# Patient Record
Sex: Female | Born: 1945 | Race: White | Hispanic: No | Marital: Single | Smoking: Never smoker
Health system: Southern US, Community
[De-identification: ages and names within clinical notes are randomized; demographics above are authoritative.]

## PROBLEM LIST (undated history)

## (undated) DIAGNOSIS — N1330 Unspecified hydronephrosis: Secondary | ICD-10-CM

## (undated) DIAGNOSIS — Z8542 Personal history of malignant neoplasm of other parts of uterus: Secondary | ICD-10-CM

## (undated) DIAGNOSIS — I10 Essential (primary) hypertension: Secondary | ICD-10-CM

## (undated) DIAGNOSIS — Z86718 Personal history of other venous thrombosis and embolism: Secondary | ICD-10-CM

## (undated) DIAGNOSIS — D4959 Neoplasm of unspecified behavior of other genitourinary organ: Secondary | ICD-10-CM

---

## 1973-07-31 HISTORY — PX: OTHER SURGICAL HISTORY: SHX169

## 1999-10-20 ENCOUNTER — Emergency Department (HOSPITAL_COMMUNITY): Admission: EM | Admit: 1999-10-20 | Discharge: 1999-10-20 | Payer: Self-pay | Admitting: Emergency Medicine

## 2007-05-13 ENCOUNTER — Emergency Department (HOSPITAL_COMMUNITY): Admission: EM | Admit: 2007-05-13 | Discharge: 2007-05-13 | Payer: Self-pay | Admitting: Emergency Medicine
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
## 2009-07-31 HISTORY — PX: ABDOMINAL HYSTERECTOMY: SHX81

## 2010-04-15 ENCOUNTER — Encounter: Admission: RE | Admit: 2010-04-15 | Discharge: 2010-04-15 | Payer: Self-pay | Admitting: Geriatric Medicine

## 2010-05-02 ENCOUNTER — Ambulatory Visit (HOSPITAL_COMMUNITY): Admission: RE | Admit: 2010-05-02 | Discharge: 2010-05-02 | Payer: Self-pay | Admitting: Obstetrics and Gynecology

## 2010-05-02 HISTORY — PX: DILATION AND CURETTAGE OF UTERUS: SHX78

## 2010-10-13 LAB — TYPE AND SCREEN: Antibody Screen: NEGATIVE

## 2010-10-13 LAB — CBC
HCT: 41.9 % (ref 36.0–46.0)
Hemoglobin: 14.1 g/dL (ref 12.0–15.0)
MCHC: 33.6 g/dL (ref 30.0–36.0)
MCV: 92.9 fL (ref 78.0–100.0)
Platelets: 287 10*3/uL (ref 150–400)
RDW: 13.4 % (ref 11.5–15.5)

## 2011-05-11 LAB — URINE MICROSCOPIC-ADD ON

## 2011-05-11 LAB — URINALYSIS, ROUTINE W REFLEX MICROSCOPIC: Glucose, UA: NEGATIVE

## 2012-12-13 ENCOUNTER — Emergency Department (HOSPITAL_COMMUNITY): Payer: No Typology Code available for payment source

## 2012-12-13 ENCOUNTER — Emergency Department (HOSPITAL_COMMUNITY)
Admission: EM | Admit: 2012-12-13 | Discharge: 2012-12-13 | Disposition: A | Discharge status: Home / Self Care | Payer: No Typology Code available for payment source | Attending: Emergency Medicine | Admitting: Emergency Medicine

## 2012-12-13 ENCOUNTER — Encounter (HOSPITAL_COMMUNITY): Payer: Self-pay | Admitting: *Deleted

## 2012-12-13 DIAGNOSIS — S8990XA Unspecified injury of unspecified lower leg, initial encounter: Secondary | ICD-10-CM | POA: Insufficient documentation

## 2012-12-13 DIAGNOSIS — IMO0002 Reserved for concepts with insufficient information to code with codable children: Secondary | ICD-10-CM | POA: Insufficient documentation

## 2012-12-13 DIAGNOSIS — Y92410 Unspecified street and highway as the place of occurrence of the external cause: Secondary | ICD-10-CM | POA: Insufficient documentation

## 2012-12-13 DIAGNOSIS — Y9389 Activity, other specified: Secondary | ICD-10-CM | POA: Insufficient documentation

## 2012-12-13 DIAGNOSIS — Z23 Encounter for immunization: Secondary | ICD-10-CM | POA: Insufficient documentation

## 2012-12-13 DIAGNOSIS — M25562 Pain in left knee: Secondary | ICD-10-CM

## 2012-12-13 DIAGNOSIS — I10 Essential (primary) hypertension: Secondary | ICD-10-CM | POA: Insufficient documentation

## 2012-12-13 HISTORY — DX: Essential (primary) hypertension: I10

## 2012-12-13 MED ORDER — BACITRACIN 500 UNIT/GM EX OINT
1.0000 "application " | TOPICAL_OINTMENT | Freq: Two times a day (BID) | CUTANEOUS | Status: DC
  Administered 2012-12-13: 1 via TOPICAL
  Filled 2012-12-13 (×2): qty 0.9

## 2012-12-13 MED ORDER — TETANUS-DIPHTH-ACELL PERTUSSIS 5-2.5-18.5 LF-MCG/0.5 IM SUSP
0.5000 mL | Freq: Once | INTRAMUSCULAR | Status: AC
  Administered 2012-12-13: 0.5 mL via INTRAMUSCULAR
  Filled 2012-12-13: qty 0.5
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
## 2012-12-13 NOTE — ED Notes (Signed)
Patient with request to use the phone. Patient to X Ray.
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
## 2012-12-14 NOTE — ED Provider Notes (Signed)
Medical screening examination/treatment/procedure(s) were performed by non-physician practitioner and as supervising physician I was immediately available for consultation/collaboration.  Geoffery Lyons, MD 12/14/12 2351

## 2012-12-30 ENCOUNTER — Encounter (HOSPITAL_COMMUNITY): Payer: Self-pay | Admitting: Emergency Medicine

## 2012-12-30 ENCOUNTER — Emergency Department (HOSPITAL_COMMUNITY)
Admission: EM | Admit: 2012-12-30 | Discharge: 2012-12-30 | Disposition: A | Discharge status: Home / Self Care | Payer: No Typology Code available for payment source | Attending: Emergency Medicine | Admitting: Emergency Medicine

## 2012-12-30 DIAGNOSIS — Z4802 Encounter for removal of sutures: Secondary | ICD-10-CM

## 2012-12-30 DIAGNOSIS — I10 Essential (primary) hypertension: Secondary | ICD-10-CM | POA: Insufficient documentation

## 2012-12-30 NOTE — ED Notes (Signed)
Pt states she had sutures placed and is here to have removal.
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
## 2012-12-30 NOTE — ED Notes (Signed)
Pt c/o suture removal to l eye brow.

## 2012-12-31 NOTE — ED Provider Notes (Signed)
Medical screening examination/treatment/procedure(s) were performed by non-physician practitioner and as supervising physician I was immediately available for consultation/collaboration.  Laureen Frederic L Donalda Job, MD 12/31/12 1533 

## 2013-05-01 ENCOUNTER — Other Ambulatory Visit: Payer: Self-pay | Admitting: Urology

## 2013-05-01 DIAGNOSIS — R1900 Intra-abdominal and pelvic swelling, mass and lump, unspecified site: Secondary | ICD-10-CM

## 2013-05-01 DIAGNOSIS — J984 Other disorders of lung: Secondary | ICD-10-CM

## 2013-05-09 ENCOUNTER — Other Ambulatory Visit: Payer: Self-pay | Admitting: Urology

## 2013-05-09 ENCOUNTER — Encounter (HOSPITAL_BASED_OUTPATIENT_CLINIC_OR_DEPARTMENT_OTHER): Payer: Self-pay | Admitting: *Deleted
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
## 2013-05-12 ENCOUNTER — Ambulatory Visit (HOSPITAL_COMMUNITY): Payer: Managed Care, Other (non HMO)

## 2013-05-12 ENCOUNTER — Encounter (HOSPITAL_BASED_OUTPATIENT_CLINIC_OR_DEPARTMENT_OTHER): Payer: Managed Care, Other (non HMO) | Admitting: Anesthesiology

## 2013-05-12 ENCOUNTER — Encounter (HOSPITAL_BASED_OUTPATIENT_CLINIC_OR_DEPARTMENT_OTHER): Payer: Self-pay

## 2013-05-12 ENCOUNTER — Ambulatory Visit (HOSPITAL_BASED_OUTPATIENT_CLINIC_OR_DEPARTMENT_OTHER): Payer: Managed Care, Other (non HMO) | Admitting: Anesthesiology

## 2013-05-12 ENCOUNTER — Encounter (HOSPITAL_BASED_OUTPATIENT_CLINIC_OR_DEPARTMENT_OTHER)
Admission: RE | Disposition: A | Discharge status: Home / Self Care | Payer: Self-pay | Source: Ambulatory Visit | Attending: Urology

## 2013-05-12 ENCOUNTER — Ambulatory Visit (HOSPITAL_BASED_OUTPATIENT_CLINIC_OR_DEPARTMENT_OTHER)
Admission: RE | Admit: 2013-05-12 | Discharge: 2013-05-12 | Disposition: A | Discharge status: Home / Self Care | Payer: Managed Care, Other (non HMO) | Source: Ambulatory Visit | Attending: Urology | Admitting: Urology

## 2013-05-12 DIAGNOSIS — I10 Essential (primary) hypertension: Secondary | ICD-10-CM | POA: Insufficient documentation

## 2013-05-12 DIAGNOSIS — Z90710 Acquired absence of both cervix and uterus: Secondary | ICD-10-CM | POA: Insufficient documentation

## 2013-05-12 DIAGNOSIS — Z8542 Personal history of malignant neoplasm of other parts of uterus: Secondary | ICD-10-CM | POA: Insufficient documentation

## 2013-05-12 DIAGNOSIS — N1330 Unspecified hydronephrosis: Secondary | ICD-10-CM | POA: Insufficient documentation

## 2013-05-12 DIAGNOSIS — C669 Malignant neoplasm of unspecified ureter: Secondary | ICD-10-CM | POA: Insufficient documentation

## 2013-05-12 DIAGNOSIS — Z79899 Other long term (current) drug therapy: Secondary | ICD-10-CM | POA: Insufficient documentation

## 2013-05-12 HISTORY — DX: Unspecified hydronephrosis: N13.30

## 2013-05-12 HISTORY — PX: CYSTOSCOPY WITH STENT PLACEMENT: SHX5790

## 2013-05-12 HISTORY — PX: CYSTOSCOPY W/ URETERAL STENT PLACEMENT: SHX1429

## 2013-05-12 LAB — POCT I-STAT 4, (NA,K, GLUC, HGB,HCT)
Hemoglobin: 12.9 g/dL (ref 12.0–15.0)
Potassium: 3.6 mEq/L (ref 3.5–5.1)
Sodium: 138 mEq/L (ref 135–145)

## 2013-05-12 SURGERY — CYSTOSCOPY, WITH RETROGRADE PYELOGRAM AND URETERAL STENT INSERTION
Anesthesia: General | Site: Ureter | Laterality: Right | Wound class: Clean Contaminated

## 2013-05-12 MED ORDER — LACTATED RINGERS IV SOLN
INTRAVENOUS | Status: DC | PRN
  Administered 2013-05-12 (×2): via INTRAVENOUS

## 2013-05-12 MED ORDER — LACTATED RINGERS IV SOLN
INTRAVENOUS | Status: DC
  Filled 2013-05-12: qty 1000

## 2013-05-12 MED ORDER — BELLADONNA ALKALOIDS-OPIUM 16.2-60 MG RE SUPP
RECTAL | Status: DC | PRN
  Administered 2013-05-12: 1 via RECTAL

## 2013-05-12 MED ORDER — SODIUM CHLORIDE 0.9 % IJ SOLN
3.0000 mL | Freq: Two times a day (BID) | INTRAMUSCULAR | Status: DC
  Filled 2013-05-12: qty 3

## 2013-05-12 MED ORDER — ONDANSETRON HCL 4 MG/2ML IJ SOLN
4.0000 mg | Freq: Four times a day (QID) | INTRAMUSCULAR | Status: DC | PRN
  Filled 2013-05-12: qty 2

## 2013-05-12 MED ORDER — ONDANSETRON HCL 4 MG/2ML IJ SOLN
INTRAMUSCULAR | Status: DC | PRN
  Administered 2013-05-12: 4 mg via INTRAMUSCULAR

## 2013-05-12 MED ORDER — OXYBUTYNIN CHLORIDE 5 MG PO TABS: 5.0000 mg | ORAL_TABLET | Freq: Three times a day (TID) | ORAL | Status: AC

## 2013-05-12 MED ORDER — DEXAMETHASONE SODIUM PHOSPHATE 4 MG/ML IJ SOLN
INTRAMUSCULAR | Status: DC | PRN
  Administered 2013-05-12: 8 mg via INTRAVENOUS

## 2013-05-12 MED ORDER — LACTATED RINGERS IV SOLN
INTRAVENOUS | Status: DC
  Administered 2013-05-12: 08:00:00 via INTRAVENOUS
  Filled 2013-05-12: qty 1000

## 2013-05-12 MED ORDER — CEFAZOLIN SODIUM-DEXTROSE 2-3 GM-% IV SOLR
INTRAVENOUS | Status: DC | PRN
  Administered 2013-05-12: 2 g via INTRAVENOUS

## 2013-05-12 MED ORDER — PROPOFOL 10 MG/ML IV BOLUS
INTRAVENOUS | Status: DC | PRN
  Administered 2013-05-12: 120 mg via INTRAVENOUS

## 2013-05-12 MED ORDER — HYDROCODONE-ACETAMINOPHEN 5-325 MG PO TABS: 1.0000 | ORAL_TABLET | ORAL | Status: AC | PRN

## 2013-05-12 MED ORDER — IOHEXOL 350 MG/ML SOLN
INTRAVENOUS | Status: DC | PRN
  Administered 2013-05-12: 10 mL

## 2013-05-12 MED ORDER — MIDAZOLAM HCL 5 MG/5ML IJ SOLN
INTRAMUSCULAR | Status: DC | PRN
  Administered 2013-05-12: 1 mg via INTRAVENOUS
  Administered 2013-05-12 (×2): 0.5 mg via INTRAVENOUS

## 2013-05-12 MED ORDER — MORPHINE SULFATE 2 MG/ML IJ SOLN
1.0000 mg | INTRAMUSCULAR | Status: DC | PRN
  Filled 2013-05-12: qty 1

## 2013-05-12 MED ORDER — SODIUM CHLORIDE 0.9 % IR SOLN
Status: DC | PRN
  Administered 2013-05-12: 6000 mL

## 2013-05-12 MED ORDER — SODIUM CHLORIDE 0.9 % IV SOLN
250.0000 mL | INTRAVENOUS | Status: DC | PRN
  Filled 2013-05-12: qty 250

## 2013-05-12 MED ORDER — ACETAMINOPHEN 650 MG RE SUPP
650.0000 mg | RECTAL | Status: DC | PRN
  Filled 2013-05-12: qty 1

## 2013-05-12 MED ORDER — SODIUM CHLORIDE 0.9 % IJ SOLN
3.0000 mL | INTRAMUSCULAR | Status: DC | PRN
  Filled 2013-05-12: qty 3

## 2013-05-12 MED ORDER — FENTANYL CITRATE 0.05 MG/ML IJ SOLN
INTRAMUSCULAR | Status: DC | PRN
  Administered 2013-05-12 (×4): 25 ug via INTRAVENOUS

## 2013-05-12 MED ORDER — KETOROLAC TROMETHAMINE 30 MG/ML IJ SOLN
INTRAMUSCULAR | Status: DC | PRN
  Administered 2013-05-12: 7 mg via INTRAVENOUS
  Administered 2013-05-12: 30 mg via INTRAVENOUS

## 2013-05-12 MED ORDER — FENTANYL CITRATE 0.05 MG/ML IJ SOLN
25.0000 ug | INTRAMUSCULAR | Status: DC | PRN
  Filled 2013-05-12: qty 1

## 2013-05-12 MED ORDER — LIDOCAINE HCL (CARDIAC) 20 MG/ML IV SOLN
INTRAVENOUS | Status: DC | PRN
  Administered 2013-05-12: 60 mg via INTRAVENOUS

## 2013-05-12 MED ORDER — OXYCODONE HCL 5 MG PO TABS
5.0000 mg | ORAL_TABLET | ORAL | Status: DC | PRN
  Filled 2013-05-12: qty 2

## 2013-05-12 MED ORDER — ACETAMINOPHEN 325 MG PO TABS
650.0000 mg | ORAL_TABLET | ORAL | Status: DC | PRN
  Filled 2013-05-12: qty 2

## 2013-05-12 MED ORDER — CIPROFLOXACIN HCL 250 MG PO TABS: 250.0000 mg | ORAL_TABLET | Freq: Two times a day (BID) | ORAL | Status: DC

## 2013-05-12 SURGICAL SUPPLY — 22 items
ADAPTER CATH URET PLST 4-6FR (CATHETERS) IMPLANT
ADPR CATH URET STRL DISP 4-6FR (CATHETERS)
BAG DRAIN URO-CYSTO SKYTR STRL (DRAIN) ×2 IMPLANT
BAG DRN UROCATH (DRAIN) ×1
CANISTER SUCT LVC 12 LTR MEDI- (MISCELLANEOUS) ×1 IMPLANT
CATH INTERMIT  6FR 70CM (CATHETERS) IMPLANT
CLOTH BEACON ORANGE TIMEOUT ST (SAFETY) ×2 IMPLANT
DRAPE CAMERA CLOSED 9X96 (DRAPES) ×2 IMPLANT
DRESSING TELFA 8X3 (GAUZE/BANDAGES/DRESSINGS) ×1 IMPLANT
FORCEPS BIOP 2.4F 115CM BACKLD (INSTRUMENTS) ×1 IMPLANT
GLOVE BIO SURGEON STRL SZ8 (GLOVE) ×2 IMPLANT
GLOVE BIOGEL PI IND STRL 7.5 (GLOVE) IMPLANT
GLOVE BIOGEL PI INDICATOR 7.5 (GLOVE) ×3
GLOVE ECLIPSE 7.0 STRL STRAW (GLOVE) ×1 IMPLANT
GOWN STRL REIN XL XLG (GOWN DISPOSABLE) ×3 IMPLANT
GUIDEWIRE 0.038 PTFE COATED (WIRE) IMPLANT
GUIDEWIRE ANG ZIPWIRE 038X150 (WIRE) IMPLANT
GUIDEWIRE STR DUAL SENSOR (WIRE) IMPLANT
IV NS IRRIG 3000ML ARTHROMATIC (IV SOLUTION) ×1 IMPLANT
NS IRRIG 500ML POUR BTL (IV SOLUTION) IMPLANT
PACK CYSTOSCOPY (CUSTOM PROCEDURE TRAY) ×2 IMPLANT
STENT CONTOUR 7FRX24 (STENTS) ×1 IMPLANT

## 2013-05-12 NOTE — Op Note (Signed)
Preoperative diagnosis:Right hydronephrosis  Postoperative diagnosis: Right hydronephrosis with ureteral tumor  Procedure:Cystoscopy, right retrograde ureteropyelogram, right ureteroscopy with biopsy of ureteral tumor, right double J stent placement , interpretive fluoroscopy   Surgeon: Bertram Millard. Darlin Stenseth, M.D.   Anesthesia: Gen.   Complications:None  Drain(s): 7 x 24 cm contour stent without string  Indications: 67 year old Latino female, recently presenting with a pelvic mass on the right and a history of gynecologic cancer. CT scan performed for abdominal pain and hematuria revealed obstruction of the right kidney with hydronephrosis. She presents at this time for cystoscopy, right retrograde pyelogram possible stent placement.    Technique and findings: The patient was properly identified in the holding area. She received preoperative IV antibiotics. The surgical site was marked. She was taken to the operating room where general anesthetic was administered with the LMA. She was placed in the dorsolithotomy position. Genitalia and perineum were prepped and draped. Proper timeout was performed.  I passed a 22 French panendoscope in her bladder. Inspection of the bladder revealed normal findings. The right ureteral orifice was cannulated with the open-ended catheter retrograde was performed. This revealed the last 3-4 cm of the ureter to be normal. However, there was a significant filling defect with a "goblet sign" proximal to this. There was no significant contrast that got by this. I then passed a sensor-tip guidewire fluoroscopically up in the right renal pelvis were curl was seen. I then removed the cystoscope. A 6 French rigid ureteroscope was then passed through the urethra, the bladder and up to this abnormal area in the right ureter. There was a papillary configuration to this tumor. I was unable to negotiate the scope by the tumor. However, using the Bigopsy biopsy forceps, I grasped  2-3 small fragments of this tumor and sent this as "right ureteral tumor". There was minor amount of bleeding at this point, but I was able to visualize the tumor at the same time. I then removed the ureteroscope. I then passed, using cystoscopic guidance, a 24 cm x 7 French contour stent without string. It was somewhat difficult to negotiate this past the tumor and up in the renal pelvis. However, with diligence this was performed.  Good proximal and distal curls were seen fluoroscopically and cystoscopically following removal of the guidewire. I could see a fair amount of urine coming from the stent at this point him a drain in the right renal unit. The bladder was then drained. The patient was then awakened and taken to the PACU in stable condition. She tolerated the procedure well.

## 2013-05-12 NOTE — Transfer of Care (Signed)
Immediate Anesthesia Transfer of Care Note  Patient: Marissa Conner  Procedure(s) Performed: Procedure(s) (LRB): CYSTOSCOPY WITH RETROGRADE PYELOGRAM/, URETERSCOPY WITH URETERAL BIOPSY (Right) CYSTOSCOPY WITH STENT PLACEMENT (Right)  Patient Location: PACU  Anesthesia Type: General  Level of Consciousness: awake, sedated, patient cooperative and responds to stimulation  Airway & Oxygen Therapy: Patient Spontanous Breathing and Patient connected to face mask oxygen  Post-op Assessment: Report given to PACU RN, Post -op Vital signs reviewed and stable and Patient moving all extremities  Post vital signs: Reviewed and stable  Complications: No apparent anesthesia complications
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
## 2013-05-12 NOTE — Progress Notes (Signed)
Takes blood thinner but not sure of name and brought prescription bottle of Losartan/HCTZ 100/12.5 mg Daily.

## 2013-05-12 NOTE — Anesthesia Postprocedure Evaluation (Signed)
  Anesthesia Post-op Note  Patient: Marissa Conner  Procedure(s) Performed: Procedure(s) (LRB): CYSTOSCOPY WITH RETROGRADE PYELOGRAM/, URETERSCOPY WITH URETERAL BIOPSY (Right) CYSTOSCOPY WITH STENT PLACEMENT (Right)  Patient Location: PACU  Anesthesia Type: General  Level of Consciousness: awake and alert   Airway and Oxygen Therapy: Patient Spontanous Breathing  Post-op Pain: mild  Post-op Assessment: Post-op Vital signs reviewed, Patient's Cardiovascular Status Stable, Respiratory Function Stable, Patent Airway and No signs of Nausea or vomiting  Last Vitals:  Filed Vitals:   05/12/13 1030  BP: 126/75  Pulse: 65  Temp:   Resp: 21    Post-op Vital Signs: stable   Complications: No apparent anesthesia complications

## 2013-05-12 NOTE — Anesthesia Preprocedure Evaluation (Addendum)
Anesthesia Evaluation  Patient identified by MRN, date of birth, ID band Patient awake    Reviewed: Allergy & Precautions, H&P , NPO status , Patient's Chart, lab work & pertinent test results  Airway Mallampati: II TM Distance: >3 FB Neck ROM: full    Dental  (+) Edentulous Upper and Missing Missing left side of lower teeth:   Pulmonary neg pulmonary ROS,  breath sounds clear to auscultation  Pulmonary exam normal       Cardiovascular Exercise Tolerance: Good hypertension, Pt. on medications Rhythm:regular Rate:Normal     Neuro/Psych negative neurological ROS  negative psych ROS   GI/Hepatic negative GI ROS, Neg liver ROS,   Endo/Other  negative endocrine ROS  Renal/GU negative Renal ROS  negative genitourinary   Musculoskeletal   Abdominal   Peds  Hematology negative hematology ROS (+)   Anesthesia Other Findings   Reproductive/Obstetrics negative OB ROS                          Anesthesia Physical Anesthesia Plan  ASA: II  Anesthesia Plan: General   Post-op Pain Management:    Induction: Intravenous  Airway Management Planned: LMA  Additional Equipment:   Intra-op Plan:   Post-operative Plan:   Informed Consent: I have reviewed the patients History and Physical, chart, labs and discussed the procedure including the risks, benefits and alternatives for the proposed anesthesia with the patient or authorized representative who has indicated his/her understanding and acceptance.   Dental Advisory Given  Plan Discussed with: CRNA and Surgeon  Anesthesia Plan Comments:         Anesthesia Quick Evaluation

## 2013-05-12 NOTE — Anesthesia Procedure Notes (Signed)
Procedure Name: LMA Insertion Date/Time: 05/12/2013 8:35 AM Performed by: Jessica Priest Pre-anesthesia Checklist: Patient identified, Emergency Drugs available, Suction available and Patient being monitored Patient Re-evaluated:Patient Re-evaluated prior to inductionOxygen Delivery Method: Circle System Utilized Preoxygenation: Pre-oxygenation with 100% oxygen Intubation Type: IV induction Ventilation: Mask ventilation without difficulty LMA: LMA inserted LMA Size: 3.0 Number of attempts: 1 Airway Equipment and Method: bite block Placement Confirmation: positive ETCO2 Tube secured with: Tape Dental Injury: Teeth and Oropharynx as per pre-operative assessment

## 2013-05-13 ENCOUNTER — Encounter (HOSPITAL_BASED_OUTPATIENT_CLINIC_OR_DEPARTMENT_OTHER): Payer: Self-pay | Admitting: Urology

## 2013-05-14 LAB — POCT I-STAT 4, (NA,K, GLUC, HGB,HCT)
Glucose, Bld: 103 mg/dL — ABNORMAL HIGH (ref 70–99)
HCT: 41 % (ref 36.0–46.0)
Hemoglobin: 13.9 g/dL (ref 12.0–15.0)
Potassium: 7.3 mEq/L (ref 3.5–5.1)

## 2013-10-29 ENCOUNTER — Other Ambulatory Visit: Payer: Self-pay | Admitting: Urology

## 2013-11-03 ENCOUNTER — Encounter (HOSPITAL_BASED_OUTPATIENT_CLINIC_OR_DEPARTMENT_OTHER): Payer: Self-pay | Admitting: *Deleted

## 2013-11-05 ENCOUNTER — Encounter (HOSPITAL_BASED_OUTPATIENT_CLINIC_OR_DEPARTMENT_OTHER): Payer: Self-pay | Admitting: *Deleted

## 2013-11-07 ENCOUNTER — Encounter (HOSPITAL_BASED_OUTPATIENT_CLINIC_OR_DEPARTMENT_OTHER): Payer: Self-pay | Admitting: *Deleted

## 2013-11-07 NOTE — Progress Notes (Addendum)
NPO AFTER MN.  ARRIVE AT 0715.  NEEDS ISTAT AND EKG.  

## 2013-11-10 ENCOUNTER — Encounter (HOSPITAL_BASED_OUTPATIENT_CLINIC_OR_DEPARTMENT_OTHER): Payer: Self-pay

## 2013-11-10 ENCOUNTER — Ambulatory Visit (HOSPITAL_BASED_OUTPATIENT_CLINIC_OR_DEPARTMENT_OTHER): Payer: Medicare Other | Admitting: Certified Registered"

## 2013-11-10 ENCOUNTER — Encounter (HOSPITAL_BASED_OUTPATIENT_CLINIC_OR_DEPARTMENT_OTHER): Payer: Medicare Other | Admitting: Certified Registered"

## 2013-11-10 ENCOUNTER — Encounter (HOSPITAL_BASED_OUTPATIENT_CLINIC_OR_DEPARTMENT_OTHER)
Admission: RE | Disposition: A | Discharge status: Home / Self Care | Payer: Self-pay | Source: Ambulatory Visit | Attending: Urology

## 2013-11-10 ENCOUNTER — Ambulatory Visit (HOSPITAL_BASED_OUTPATIENT_CLINIC_OR_DEPARTMENT_OTHER)
Admission: RE | Admit: 2013-11-10 | Discharge: 2013-11-10 | Disposition: A | Discharge status: Home / Self Care | Payer: Medicare Other | Source: Ambulatory Visit | Attending: Urology | Admitting: Urology

## 2013-11-10 DIAGNOSIS — N1330 Unspecified hydronephrosis: Secondary | ICD-10-CM | POA: Insufficient documentation

## 2013-11-10 DIAGNOSIS — N289 Disorder of kidney and ureter, unspecified: Secondary | ICD-10-CM | POA: Insufficient documentation

## 2013-11-10 DIAGNOSIS — Z86718 Personal history of other venous thrombosis and embolism: Secondary | ICD-10-CM | POA: Insufficient documentation

## 2013-11-10 DIAGNOSIS — Z90710 Acquired absence of both cervix and uterus: Secondary | ICD-10-CM | POA: Insufficient documentation

## 2013-11-10 DIAGNOSIS — L659 Nonscarring hair loss, unspecified: Secondary | ICD-10-CM | POA: Insufficient documentation

## 2013-11-10 DIAGNOSIS — Z79899 Other long term (current) drug therapy: Secondary | ICD-10-CM | POA: Insufficient documentation

## 2013-11-10 DIAGNOSIS — I10 Essential (primary) hypertension: Secondary | ICD-10-CM | POA: Insufficient documentation

## 2013-11-10 DIAGNOSIS — Z8542 Personal history of malignant neoplasm of other parts of uterus: Secondary | ICD-10-CM | POA: Insufficient documentation

## 2013-11-10 HISTORY — DX: Personal history of other venous thrombosis and embolism: Z86.718

## 2013-11-10 HISTORY — PX: CYSTOSCOPY W/ URETERAL STENT PLACEMENT: SHX1429

## 2013-11-10 HISTORY — DX: Personal history of malignant neoplasm of other parts of uterus: Z85.42

## 2013-11-10 HISTORY — DX: Neoplasm of unspecified behavior of other genitourinary organ: D49.59

## 2013-11-10 LAB — POCT I-STAT 4, (NA,K, GLUC, HGB,HCT)
Glucose, Bld: 106 mg/dL — ABNORMAL HIGH (ref 70–99)
HEMATOCRIT: 26 % — AB (ref 36.0–46.0)
HEMOGLOBIN: 8.8 g/dL — AB (ref 12.0–15.0)
Potassium: 4 mEq/L (ref 3.7–5.3)
SODIUM: 138 meq/L (ref 137–147)

## 2013-11-10 SURGERY — CYSTOSCOPY, FLEXIBLE, WITH STENT REPLACEMENT
Anesthesia: General | Site: Ureter | Laterality: Right

## 2013-11-10 MED ORDER — FENTANYL CITRATE 0.05 MG/ML IJ SOLN
INTRAMUSCULAR | Status: DC | PRN
Start: 2013-11-10 — End: 2013-11-10
  Administered 2013-11-10: 50 ug via INTRAVENOUS
  Administered 2013-11-10: 25 ug via INTRAVENOUS

## 2013-11-10 MED ORDER — CEFAZOLIN SODIUM-DEXTROSE 2-3 GM-% IV SOLR
2.0000 g | INTRAVENOUS | Status: AC
  Administered 2013-11-10: 2 g via INTRAVENOUS
  Filled 2013-11-10: qty 50

## 2013-11-10 MED ORDER — STERILE WATER FOR IRRIGATION IR SOLN
Status: DC | PRN
Start: 2013-11-10 — End: 2013-11-10
  Administered 2013-11-10: 3000 mL

## 2013-11-10 MED ORDER — MEPERIDINE HCL 25 MG/ML IJ SOLN
6.2500 mg | INTRAMUSCULAR | Status: DC | PRN
Start: 2013-11-10 — End: 2013-11-10
  Filled 2013-11-10: qty 1

## 2013-11-10 MED ORDER — OXYCODONE HCL 5 MG PO TABS
5.0000 mg | ORAL_TABLET | Freq: Once | ORAL | Status: DC | PRN
  Filled 2013-11-10: qty 1

## 2013-11-10 MED ORDER — PROMETHAZINE HCL 25 MG/ML IJ SOLN
6.2500 mg | INTRAMUSCULAR | Status: DC | PRN
  Filled 2013-11-10: qty 1

## 2013-11-10 MED ORDER — BELLADONNA ALKALOIDS-OPIUM 16.2-60 MG RE SUPP
RECTAL | Status: AC
  Filled 2013-11-10: qty 1

## 2013-11-10 MED ORDER — LIDOCAINE HCL (CARDIAC) 20 MG/ML IV SOLN
INTRAVENOUS | Status: DC | PRN
  Administered 2013-11-10: 50 mg via INTRAVENOUS

## 2013-11-10 MED ORDER — OXYCODONE HCL 5 MG/5ML PO SOLN
5.0000 mg | Freq: Once | ORAL | Status: DC | PRN
  Filled 2013-11-10: qty 5

## 2013-11-10 MED ORDER — LACTATED RINGERS IV SOLN
INTRAVENOUS | Status: DC
  Administered 2013-11-10: 08:00:00 via INTRAVENOUS
  Filled 2013-11-10: qty 1000

## 2013-11-10 MED ORDER — ONDANSETRON HCL 4 MG/2ML IJ SOLN
INTRAMUSCULAR | Status: DC | PRN
  Administered 2013-11-10: 4 mg via INTRAVENOUS

## 2013-11-10 MED ORDER — CEFAZOLIN SODIUM 1-5 GM-% IV SOLN
1.0000 g | INTRAVENOUS | Status: DC
  Filled 2013-11-10: qty 50

## 2013-11-10 MED ORDER — HYDROMORPHONE HCL PF 1 MG/ML IJ SOLN
0.2500 mg | INTRAMUSCULAR | Status: DC | PRN
  Filled 2013-11-10: qty 1

## 2013-11-10 MED ORDER — MIDAZOLAM HCL 2 MG/2ML IJ SOLN
INTRAMUSCULAR | Status: AC
  Filled 2013-11-10: qty 2

## 2013-11-10 MED ORDER — PROPOFOL 10 MG/ML IV BOLUS
INTRAVENOUS | Status: DC | PRN
  Administered 2013-11-10: 120 mg via INTRAVENOUS

## 2013-11-10 MED ORDER — FENTANYL CITRATE 0.05 MG/ML IJ SOLN
INTRAMUSCULAR | Status: AC
  Filled 2013-11-10: qty 4

## 2013-11-10 SURGICAL SUPPLY — 19 items
ADAPTER CATH URET PLST 4-6FR (CATHETERS) IMPLANT
ADPR CATH URET STRL DISP 4-6FR (CATHETERS)
BAG DRAIN URO-CYSTO SKYTR STRL (DRAIN) ×3 IMPLANT
BAG DRN UROCATH (DRAIN) ×1
CANISTER SUCT LVC 12 LTR MEDI- (MISCELLANEOUS) ×3 IMPLANT
CATH INTERMIT  6FR 70CM (CATHETERS) IMPLANT
CLOTH BEACON ORANGE TIMEOUT ST (SAFETY) ×3 IMPLANT
DRAPE CAMERA CLOSED 9X96 (DRAPES) ×3 IMPLANT
GLOVE BIO SURGEON STRL SZ8 (GLOVE) ×3 IMPLANT
GLOVE SURG SS PI 7.5 STRL IVOR (GLOVE) ×4 IMPLANT
GOWN STRL REUS W/TWL XL LVL3 (GOWN DISPOSABLE) ×4 IMPLANT
GUIDEWIRE 0.038 PTFE COATED (WIRE) IMPLANT
GUIDEWIRE ANG ZIPWIRE 038X150 (WIRE) IMPLANT
GUIDEWIRE STR DUAL SENSOR (WIRE) ×2 IMPLANT
NS IRRIG 500ML POUR BTL (IV SOLUTION) IMPLANT
PACK CYSTOSCOPY (CUSTOM PROCEDURE TRAY) ×3 IMPLANT
STENT ×1 IMPLANT
STENT CONTOUR 7FRX24 (STENTS) ×2 IMPLANT
WATER STERILE IRR 3000ML UROMA (IV SOLUTION) ×2 IMPLANT

## 2013-11-10 NOTE — Anesthesia Preprocedure Evaluation (Signed)
Anesthesia Evaluation  Patient identified by MRN, date of birth, ID band Patient awake    Reviewed: Allergy & Precautions, H&P , NPO status , Patient's Chart, lab work & pertinent test results  Airway Mallampati: II TM Distance: >3 FB Neck ROM: full    Dental  (+) Edentulous Upper, Missing Missing left side of lower teeth:   Pulmonary neg pulmonary ROS,  breath sounds clear to auscultation  Pulmonary exam normal       Cardiovascular Exercise Tolerance: Good hypertension, Pt. on medications Rhythm:regular Rate:Normal     Neuro/Psych negative neurological ROS  negative psych ROS   GI/Hepatic negative GI ROS, Neg liver ROS,   Endo/Other  negative endocrine ROS  Renal/GU Renal disease  negative genitourinary   Musculoskeletal   Abdominal   Peds  Hematology negative hematology ROS (+)   Anesthesia Other Findings   Reproductive/Obstetrics negative OB ROS                           Anesthesia Physical  Anesthesia Plan  ASA: II  Anesthesia Plan: General   Post-op Pain Management:    Induction: Intravenous  Airway Management Planned: LMA  Additional Equipment:   Intra-op Plan:   Post-operative Plan:   Informed Consent: I have reviewed the patients History and Physical, chart, labs and discussed the procedure including the risks, benefits and alternatives for the proposed anesthesia with the patient or authorized representative who has indicated his/her understanding and acceptance.   Dental Advisory Given  Plan Discussed with: CRNA and Surgeon  Anesthesia Plan Comments:         Anesthesia Quick Evaluation

## 2013-11-10 NOTE — H&P (Signed)
  H&P  Chief Complaint: Blocked kidney  History of Present Illness: Marissa Conner is a 68 y.o. 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 She underwent cystoscopy, ureteroscopy and stent placement in October, 2014. She had a recent CT which revealed slight worsening of right sided hydronephrosis, with the stent adequately positioned. She presents for stent change. Past Medical History  Diagnosis Date  . Hydronephrosis, right   . Hypertension   . 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 History of endometrial cancer     S/P HYSTERECTOMY  2011  . Ureteral tumor     RIGHT    Past Surgical History  Procedure Laterality Date  . Dilation and curettage of uterus  05-02-2010  . Benign breast bx  1975  . Abdominal hysterectomy  2011  . Cystoscopy w/ ureteral stent placement Right 05/12/2013    Procedure: CYSTOSCOPY WITH RETROGRADE PYELOGRAM/, URETERSCOPY WITH URETERAL BIOPSY;  Surgeon: Franchot Gallo, MD;  Location: [facility];  Service: Urology;  Laterality: Right;  . Cystoscopy with stent placement Right 05/12/2013    Procedure: CYSTOSCOPY WITH STENT PLACEMENT;  Surgeon: Franchot Gallo, MD;  Location: [facility];  Service: Urology;  Laterality: Right;    Home Medications:  No prescriptions prior to admission    Allergies: No Known Allergies  History reviewed. No pertinent family history.  Social History:  reports that she has never smoked. She has never used smokeless tobacco. She reports that she does not drink alcohol or use illicit drugs.  ROS: A complete review of systems was performed.  All systems are negative except for pertinent findings as noted.  Physical Exam:  Vital signs in last 24 hours:   General:  Alert and oriented, No acute distress. Alopecia HEENT: Normocephalic, atraumatic Neck: No JVD or  lymphadenopathy Cardiovascular: Regular rate and rhythm Lungs: Clear bilaterally Abdomen: Soft, nontender, nondistended, no abdominal masses Back: No CVA tenderness Extremities: No edema Neurologic: Grossly intact  Laboratory Data:  No results found for this or any previous visit (from the past 24 hour(s)). No results found for this or any previous visit (from the past 240 hour(s)). Creatinine: No results found for this basename: CREATININE,  in the last 168 hours  Radiologic Imaging: No results found.  Impression/Assessment:  Malignant hydronephrosis on the right  Plan:  Cystoscopy, right J2 stent change  Franchot Gallo 11/10/2013, 6:26 AM  [name]. Jorryn Hershberger MD

## 2013-11-10 NOTE — Anesthesia Postprocedure Evaluation (Signed)
Anesthesia Post Note  Patient: Marissa Conner  Procedure(s) Performed: Procedure(s) (LRB): CYSTOSCOPY WITH STENT REPLACEMENT (Right)  Anesthesia type: General  Patient location: PACU  Post pain: Pain level controlled  Post assessment: Post-op Vital signs reviewed  Last Vitals: BP 128/76  Pulse 67  Temp(Src) 37.3 C (Oral)  Resp 14  Wt 134 lb (60.782 kg)  SpO2 98%  Post vital signs: Reviewed  Level of consciousness: sedated  Complications: No apparent anesthesia complications

## 2013-11-10 NOTE — Transfer of Care (Signed)
Immediate Anesthesia Transfer of Care Note  Patient: Marissa Conner  Procedure(s) Performed: Procedure(s) (LRB): CYSTOSCOPY WITH STENT REPLACEMENT (Right)  Patient Location: PACU  Anesthesia Type: General  Level of Consciousness: awake, oriented, sedated and patient cooperative  Airway & Oxygen Therapy: Patient Spontanous Breathing and Patient connected to face mask oxygen  Post-op Assessment: Report given to PACU RN and Post -op Vital signs reviewed and stable  Post vital signs: Reviewed and stable  Complications: No apparent anesthesia complications

## 2013-11-10 NOTE — Anesthesia Procedure Notes (Signed)
Procedure Name: LMA Insertion Date/Time: 11/10/2013 8:36 AM Performed by: Denna Haggard D Pre-anesthesia Checklist: Patient identified, Emergency Drugs available, Suction available and Patient being monitored Patient Re-evaluated:Patient Re-evaluated prior to inductionOxygen Delivery Method: Circle System Utilized Preoxygenation: Pre-oxygenation with 100% oxygen Intubation Type: IV induction Ventilation: Mask ventilation without difficulty LMA: LMA inserted LMA Size: 3.0 Number of attempts: 1 Airway Equipment and Method: bite block Placement Confirmation: positive ETCO2 Tube secured with: Tape Dental Injury: Teeth and Oropharynx as per pre-operative assessment

## 2013-11-10 NOTE — Op Note (Signed)
PATIENT:  Marissa Conner  PRE-OPERATIVE DIAGNOSIS: Right-sided malignant hydronephrosis   POST-OPERATIVE DIAGNOSIS: Same  PROCEDURE: Cystoscopy, right double-J stent exchange  SURGEON:  [name]. Mckennah Kretchmer, M.D.  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 She underwent cystoscopy and double-J stent placement in October, 2014. She presents for cystoscopy and double-J stent exchange.  Description of procedure: The patient was properly identified and marked (if applicable) in the holding area. They were then  taken to the operating room and placed on the table in a supine position. General anesthesia was then administered. Once fully anesthetized the patient was moved to the dorsolithotomy position and the genitalia and perineum were sterilely prepped and draped in standard fashion. An official timeout was then performed.  A 22 French panendoscope was placed in the bladder. It was circumferentially inspected and found to be normal except for a stent protruding through the right ureteral orifice. This was grasped with biopsy forceps, and brought out through the urethra. A sensor-tip guidewire was passed under fluoroscopic guidance through the stent and up into the right renal pelvis were curl was seen. The stent was then removed over top of the guidewire. I then replaced a 24 cm x 7 French contour double-J stent, with   the string removed. Good proximal and distal curls were seen using fluoroscopic and cystoscopic guidance. At this point, the bladder was drained, the scope removed, and the procedure terminated.   PLAN OF CARE: Discharge to home after PACU  PATIENT DISPOSITION:  PACU - hemodynamically stable.

## 2013-11-10 NOTE — Discharge Instructions (Signed)
1. You may see some blood in the urine and may have some burning with urination for 48-72 hours. You also may notice that you have to urinate more frequently or urgently after your procedure which is normal.  °2. You should call should you develop an inability urinate, fever > 101, persistent nausea and vomiting that prevents you from eating or drinking to stay hydrated.  °3. If you have a stent, you will likely urinate more frequently and urgently until the stent is removed and you may experience some discomfort/pain in the lower abdomen and flank especially when urinating. You may take pain medication prescribed to you if needed for pain. You may also intermittently have blood in the urine until the stent is removed. °4. If you have a catheter, you will be taught how to take care of the catheter by the nursing staff prior to discharge from the hospital.  You may periodically feel a strong urge to void with the catheter in place.  This is a bladder spasm and most often can occur when having a bowel movement or moving around. It is typically self-limited and usually will stop after a few minutes.  You may use some Vaseline or Neosporin around the tip of the catheter to reduce friction at the tip of the penis. You may also see some blood in the urine.  A very small amount of blood can make the urine look quite red.  As long as the catheter is draining well, there usually is not a problem.  However, if the catheter is not draining well and is bloody, you should call the office ([phone]) to notify us. ° ° °Post Anesthesia Home Care Instructions ° °Activity: °Get plenty of rest for the remainder of the day. A responsible adult should stay with you for 24 hours following the procedure.  °For the next 24 hours, DO NOT: °-Drive a car °-Operate machinery °-Drink alcoholic beverages °-Take any medication unless instructed by your physician °-Make any legal decisions or sign important papers. ° °Meals: °Start with liquid  foods such as gelatin or soup. Progress to regular foods as tolerated. Avoid greasy, spicy, heavy foods. If nausea and/or vomiting occur, drink only clear liquids until the nausea and/or vomiting subsides. Call your physician if vomiting continues. ° °Special Instructions/Symptoms: °Your throat may feel dry or sore from the anesthesia or the breathing tube placed in your throat during surgery. If this causes discomfort, gargle with warm salt water. The discomfort should disappear within 24 hours. ° ° °

## 2013-11-11 ENCOUNTER — Encounter (HOSPITAL_BASED_OUTPATIENT_CLINIC_OR_DEPARTMENT_OTHER): Payer: Self-pay | Admitting: Urology

## 2014-03-31 DEATH — deceased
